# Patient Record
Sex: Female | Born: 1990 | Hispanic: Yes | Marital: Single | Smoking: Never smoker
Health system: Southern US, Community
[De-identification: ages and names within clinical notes are randomized; demographics above are authoritative.]

---

## 2019-03-31 ENCOUNTER — Encounter (HOSPITAL_COMMUNITY): Payer: Self-pay | Admitting: *Deleted

## 2019-03-31 ENCOUNTER — Other Ambulatory Visit (HOSPITAL_COMMUNITY): Payer: Self-pay | Admitting: *Deleted

## 2019-03-31 DIAGNOSIS — N6320 Unspecified lump in the left breast, unspecified quadrant: Secondary | ICD-10-CM

## 2019-04-01 ENCOUNTER — Encounter (HOSPITAL_COMMUNITY): Payer: Self-pay | Admitting: *Deleted

## 2019-04-06 ENCOUNTER — Telehealth (HOSPITAL_COMMUNITY): Payer: Self-pay | Admitting: *Deleted

## 2019-04-06 NOTE — Telephone Encounter (Signed)
Telephoned patient at home number and confirmed [facility] appointment for April 21.No COVID-19 symptoms. No travel outside of [place] in the past 14 days. No contact with someone with a confirmed diagnosis of COVID-19. Used interpreter Delorise Royals.

## 2019-04-07 ENCOUNTER — Ambulatory Visit (HOSPITAL_COMMUNITY)
Admission: RE | Admit: 2019-04-07 | Discharge: 2019-04-07 | Disposition: A | Discharge status: Home / Self Care | Payer: Self-pay | Source: Ambulatory Visit | Attending: Obstetrics and Gynecology | Admitting: Obstetrics and Gynecology

## 2019-04-07 ENCOUNTER — Other Ambulatory Visit: Payer: Self-pay

## 2019-04-07 ENCOUNTER — Encounter (HOSPITAL_COMMUNITY): Payer: Self-pay

## 2019-04-07 VITALS — BP 108/68 | Temp 97.8°F | Wt 147.0 lb

## 2019-04-07 DIAGNOSIS — N644 Mastodynia: Secondary | ICD-10-CM

## 2019-04-07 DIAGNOSIS — Z1239 Encounter for other screening for malignant neoplasm of breast: Secondary | ICD-10-CM

## 2019-04-07 NOTE — Patient Instructions (Signed)
Explained breast self awareness with Florence Canner. Patient did not need a Pap smear today due to last Pap smear was 03/04/2019. Let her know [facility] will cover Pap smears every 3 years unless has a history of abnormal Pap smears. Referred patient to the [facility] for a left breast ultrasound. Appointment scheduled for Thursday, April 09, 2019 at 0930. Patient aware of appointment and will be there. Laura Hobbs verbalized understanding.  Gredmarie Delange, Kathaleen Maser, RN 12:49 PM

## 2019-04-07 NOTE — Progress Notes (Signed)
Complaints of left outer to center breast pain x 4 years ago that comes and goes. Patient stated the pain has become constant within the past 37-months. Patient rates the pain at a 6 out of 10. Patient stated she expressed some milky discharge last month and her left breast was swollen at one time. Patient had a breast exam at the [facility] in March 2020 that a left breast lump was palpated. Patient states she is unable to feel the lump now.  Pap Smear: Pap smear not completed today. Last Pap smear was 03/04/2019 at the [facility] and normal. Per patient has no history of an abnormal Pap smear. Last Pap smear result is in Epic.  Physical exam: Breasts Breasts symmetrical. No skin abnormalities bilateral breasts. No nipple retraction bilateral breasts. No nipple discharge bilateral breasts. No lymphadenopathy. No lumps palpated bilateral breasts. Unable to palpate a lump in patients area of concern. Complaints of left upper outer breast tenderness on exam. Referred patient to the [facility] for a left breast ultrasound. Appointment scheduled for Thursday, April 09, 2019 at 0930.        Pelvic/Bimanual No Pap smear completed today since last Pap smear was 03/04/2019. Pap smear not indicated per [facility] guidelines.   Smoking History: Patient has never smoked.  Patient Navigation: Patient education provided. Access to services provided for patient through [facility] program. Spanish interpreter provided.   Breast and Cervical Cancer Risk Assessment: Patient has no family history of breast cancer, known genetic mutations, or radiation treatment to the chest before age 7. Patient has no history of cervical dysplasia, immunocompromised, or DES exposure in-utero. Breast cancer risk assessment completed. No breast cancer risk calculated due to patient is less than 34 years old.  Used Spanish interpreter [employer] from [place].

## 2019-04-09 ENCOUNTER — Other Ambulatory Visit: Payer: Self-pay

## 2019-04-09 ENCOUNTER — Ambulatory Visit
Admission: RE | Admit: 2019-04-09 | Discharge: 2019-04-09 | Disposition: A | Discharge status: Home / Self Care | Payer: No Typology Code available for payment source | Source: Ambulatory Visit | Attending: Obstetrics and Gynecology | Admitting: Obstetrics and Gynecology

## 2019-04-09 DIAGNOSIS — N6320 Unspecified lump in the left breast, unspecified quadrant: Secondary | ICD-10-CM

## 2019-05-01 ENCOUNTER — Encounter (HOSPITAL_COMMUNITY): Payer: Self-pay | Admitting: *Deleted

## 2019-10-31 IMAGING — US ULTRASOUND LEFT BREAST LIMITED
1 series · 2 of 2 positions shown · non-contrast
Comparison: None.

CLINICAL DATA: 28-year-old with and intermittent palpable lump in
the UPPER OUTER periareolar LEFT breast. Patient also describes
intermittent sharp diffuse LEFT breast pain.

EXAM:
ULTRASOUND OF THE LEFT BREAST

[Series 1: ultrasound left breast limited · 0.06mm/px · 2 of 2 slices shown]
[im 1/2]
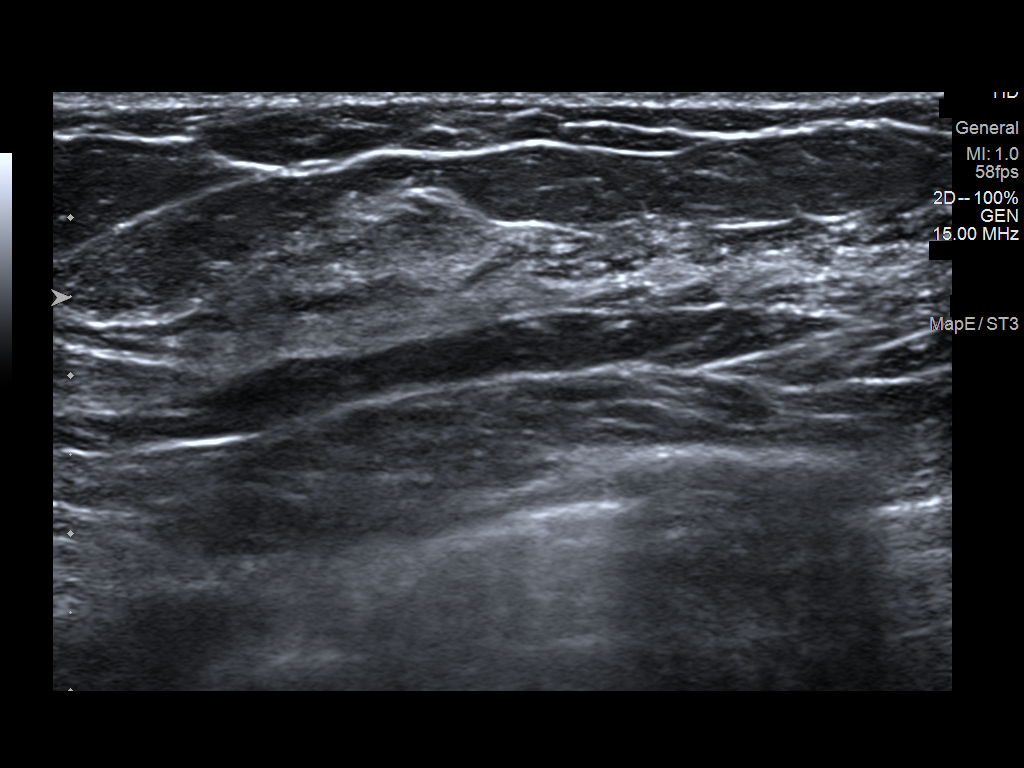
[im 2/2]
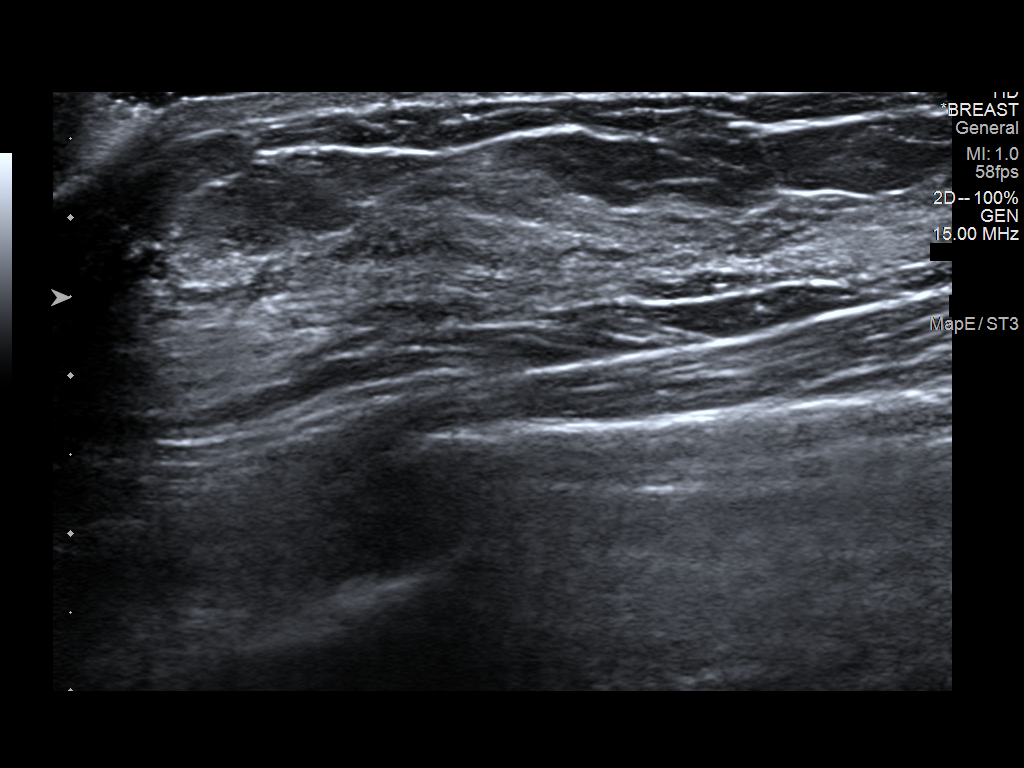

[2 of 2 positions shown; findings below may reference images not displayed]

FINDINGS: On correlative physical exam, there is a palpable ridge of tissue in
the UPPER OUTER periareolar LEFT breast in the area of patient
concern, though I do not palpate a discrete mass.

Targeted LEFT breast ultrasound is performed, showing normal
fibroglandular tissue at the 1 o'clock position approximately 2 cm
from the nipple in the area of palpable concern. No cyst, solid mass
or abnormal acoustic shadowing is identified.
IMPRESSION: Normal examination. Specifically, no mass in the area of palpable
concern in the UPPER OUTER periareolar LEFT breast.

RECOMMENDATION:
Screening mammography to begin at age 40 unless there are persistent
or intervening clinical concerns.

Strategies for alleviating breast pain including decreasing caffeine
intake, vitamin-E supplementation, and avoidance of tight
compression brassieres, including underwire brassieres, were
discussed with the patient.

I have discussed the findings and recommendations with the patient.
Communication with the patient was achieved with the assistance of a
certified interpreter.

BI-RADS CATEGORY  1: Negative.

## 2020-03-25 ENCOUNTER — Ambulatory Visit (INDEPENDENT_AMBULATORY_CARE_PROVIDER_SITE_OTHER): Payer: No Typology Code available for payment source | Admitting: Primary Care

## 2020-04-08 ENCOUNTER — Ambulatory Visit (INDEPENDENT_AMBULATORY_CARE_PROVIDER_SITE_OTHER): Payer: No Typology Code available for payment source | Admitting: Primary Care
